# Patient Record
Sex: Male | Born: 1980
Health system: Southern US, Community
[De-identification: ages and names within clinical notes are randomized; demographics above are authoritative.]

---

## 2015-08-15 ENCOUNTER — Encounter (HOSPITAL_BASED_OUTPATIENT_CLINIC_OR_DEPARTMENT_OTHER): Payer: Self-pay | Admitting: *Deleted

## 2015-08-15 ENCOUNTER — Emergency Department (HOSPITAL_BASED_OUTPATIENT_CLINIC_OR_DEPARTMENT_OTHER)
Admission: EM | Admit: 2015-08-15 | Discharge: 2015-08-15 | Discharge status: Against Medical Advice | Payer: Self-pay | Attending: Dermatology | Admitting: Dermatology

## 2015-08-15 DIAGNOSIS — Z5321 Procedure and treatment not carried out due to patient leaving prior to being seen by health care provider: Secondary | ICD-10-CM | POA: Insufficient documentation

## 2015-08-15 DIAGNOSIS — H5711 Ocular pain, right eye: Secondary | ICD-10-CM | POA: Insufficient documentation

## 2015-08-15 MED ORDER — FLUORESCEIN SODIUM 1 MG OP STRP
ORAL_STRIP | OPHTHALMIC | Status: AC
  Filled 2015-08-15: qty 1

## 2015-08-15 MED ORDER — TETRACAINE HCL 0.5 % OP SOLN
OPHTHALMIC | Status: AC
  Filled 2015-08-15: qty 4

## 2015-08-15 NOTE — ED Triage Notes (Signed)
Patient called 3x, no answer. D/C LWBS at this time.

## 2015-08-15 NOTE — ED Triage Notes (Signed)
Patient states he accidentally hit his right eye with his finger this morning at 0700.  States he has pain and sensitivity to light.

## 2015-08-15 NOTE — ED Notes (Signed)
Called patient to treatment room, no answer. Looked outside and near vending machines.

## 2016-10-14 ENCOUNTER — Encounter (HOSPITAL_BASED_OUTPATIENT_CLINIC_OR_DEPARTMENT_OTHER): Payer: Self-pay | Admitting: *Deleted

## 2016-10-14 ENCOUNTER — Emergency Department (HOSPITAL_BASED_OUTPATIENT_CLINIC_OR_DEPARTMENT_OTHER)
Admission: EM | Admit: 2016-10-14 | Discharge: 2016-10-14 | Disposition: A | Discharge status: Home / Self Care | Payer: Self-pay | Attending: Emergency Medicine | Admitting: Emergency Medicine

## 2016-10-14 DIAGNOSIS — Y929 Unspecified place or not applicable: Secondary | ICD-10-CM | POA: Insufficient documentation

## 2016-10-14 DIAGNOSIS — Y998 Other external cause status: Secondary | ICD-10-CM | POA: Insufficient documentation

## 2016-10-14 DIAGNOSIS — S29012A Strain of muscle and tendon of back wall of thorax, initial encounter: Secondary | ICD-10-CM | POA: Insufficient documentation

## 2016-10-14 DIAGNOSIS — Y9389 Activity, other specified: Secondary | ICD-10-CM | POA: Insufficient documentation

## 2016-10-14 DIAGNOSIS — S39012A Strain of muscle, fascia and tendon of lower back, initial encounter: Secondary | ICD-10-CM

## 2016-10-14 DIAGNOSIS — X500XXA Overexertion from strenuous movement or load, initial encounter: Secondary | ICD-10-CM | POA: Insufficient documentation

## 2016-10-14 DIAGNOSIS — F17210 Nicotine dependence, cigarettes, uncomplicated: Secondary | ICD-10-CM | POA: Insufficient documentation

## 2016-10-14 MED ORDER — IBUPROFEN 400 MG PO TABS
600.0000 mg | ORAL_TABLET | Freq: Once | ORAL | Status: AC
  Administered 2016-10-14: 600 mg via ORAL
  Filled 2016-10-14: qty 1

## 2016-10-14 NOTE — ED Notes (Signed)
EDP into room, prior to RN assessment, see MD notes, orders received to medicate and d/c. Care assumed at time of d/c. Tx and d/c initiated by other RN. This pt not seen/visualized by this RN.

## 2016-10-14 NOTE — ED Provider Notes (Signed)
[facility]   CSN: [id number] Arrival date & time: 10/14/16  2155     History   Chief Complaint Chief Complaint  Patient presents with  . Back Injury    HPI Carl Frost is a 36 y.o. male.  HPI  36 year old male presents with acute right-sided thoracic back pain. This morning at around 9 AM he was lifting a heavy box and blocks weight made him start to fall backwards. He dropped the box and bent backwards but did not fall or have any direct trauma to his back. Since then he has had some mild pain in his right thoracic back. Just prior to arrival the pain seems all of a sudden worsened. Some ibuprofen when it first started but has not taken anything since he got worse. No radiation of the pain. No midline back pain. No weakness, numbness, or bowel/bladder incontinence. Any type of movement makes it worse.   History reviewed. No pertinent past medical history.  There are no active problems to display for this patient.   History reviewed. No pertinent surgical history.     Home Medications    Prior to Admission medications   Not on File    Family History No family history on file.  Social History Social History  Substance Use Topics  . Smoking status: Current Every Day Smoker    Packs/day: 0.50    Types: Cigarettes  . Smokeless tobacco: Not on file  . Alcohol use No     Allergies   Penicillins   Review of Systems Review of Systems  Constitutional: Negative for fever.  Gastrointestinal: Negative for abdominal pain.  Genitourinary: Negative for dysuria and hematuria.  Musculoskeletal: Positive for back pain.  Neurological: Negative for weakness and numbness.  All other systems reviewed and are negative.    Physical Exam Updated Vital Signs BP 122/90 (BP Location: Left Arm)   Pulse 69   Temp 98.6 F (37 C) (Oral)   Resp 16   Ht  (1.803 m)   Wt 86.2 kg (190 lb)   SpO2 100%   BMI 26.50 kg/m   Physical Exam    Constitutional: He is oriented to person, place, and time. He appears well-developed and well-nourished.  HENT:  Head: Normocephalic and atraumatic.  Right Ear: External ear normal.  Left Ear: External ear normal.  Nose: Nose normal.  Eyes: Right eye exhibits no discharge. Left eye exhibits no discharge.  Neck: Neck supple.  Cardiovascular: Normal rate, regular rhythm and normal heart sounds.   Pulmonary/Chest: Effort normal and breath sounds normal.  Abdominal: Soft. He exhibits no distension. There is no tenderness.  Musculoskeletal: He exhibits no edema.       Thoracic back: He exhibits tenderness (mild). He exhibits no bony tenderness.       Lumbar back: He exhibits no tenderness and no bony tenderness.       Back:  Neurological: He is alert and oriented to person, place, and time.  5/5 strength in BLE. Normal gross sensation  Skin: Skin is warm and dry.  Nursing note and vitals reviewed.    ED Treatments / Results  Labs (all labs ordered are listed, but only abnormal results are displayed) Labs Reviewed - No data to display  EKG  EKG Interpretation None       Radiology No results found.  Procedures Procedures (including critical care time)  Medications Ordered in ED Medications  ibuprofen (ADVIL,MOTRIN) tablet 600 mg (600 mg Oral Given 10/14/16  2234)     Initial Impression / Assessment and Plan / ED Course  I have reviewed the triage vital signs and the nursing notes.  Pertinent labs & imaging results that were available during my care of the patient were reviewed by me and considered in my medical decision making (see chart for details).     Patient presents with an acute muscle strain of his thoracic back. No concerning signs or symptoms such as weakness or numbness in his extremities or incontinence. Very low suspicion for an acute bony injury, ligamentous injury, or spinal cord emergency. NSAIDs, Tylenol, and heat. Discussed return  precautions.  Final Clinical Impressions(s) / ED Diagnoses   Final diagnoses:  Back strain, initial encounter    New Prescriptions There are no discharge medications for this patient.    Pricilla Loveless, MD 10/14/16 2306

## 2016-10-14 NOTE — ED Triage Notes (Signed)
Pt c/o right lower back injury x 12 hrs ago

## 2017-09-25 ENCOUNTER — Other Ambulatory Visit: Payer: Self-pay

## 2017-09-25 ENCOUNTER — Encounter (HOSPITAL_BASED_OUTPATIENT_CLINIC_OR_DEPARTMENT_OTHER): Payer: Self-pay | Admitting: Emergency Medicine

## 2017-09-25 ENCOUNTER — Emergency Department (HOSPITAL_BASED_OUTPATIENT_CLINIC_OR_DEPARTMENT_OTHER): Payer: Self-pay

## 2017-09-25 ENCOUNTER — Emergency Department (HOSPITAL_BASED_OUTPATIENT_CLINIC_OR_DEPARTMENT_OTHER)
Admission: EM | Admit: 2017-09-25 | Discharge: 2017-09-25 | Disposition: A | Discharge status: Home / Self Care | Payer: Self-pay | Attending: Emergency Medicine | Admitting: Emergency Medicine

## 2017-09-25 DIAGNOSIS — F17210 Nicotine dependence, cigarettes, uncomplicated: Secondary | ICD-10-CM | POA: Insufficient documentation

## 2017-09-25 DIAGNOSIS — J40 Bronchitis, not specified as acute or chronic: Secondary | ICD-10-CM | POA: Insufficient documentation

## 2017-09-25 MED ORDER — ALBUTEROL SULFATE HFA 108 (90 BASE) MCG/ACT IN AERS
1.0000 | INHALATION_SPRAY | Freq: Once | RESPIRATORY_TRACT | Status: AC
  Administered 2017-09-25: 1 via RESPIRATORY_TRACT
  Filled 2017-09-25: qty 6.7

## 2017-09-25 MED ORDER — PREDNISONE 10 MG PO TABS: 40.0000 mg | ORAL_TABLET | Freq: Every day | ORAL | 0 refills | Status: AC

## 2017-09-25 MED ORDER — BENZONATATE 100 MG PO CAPS: 100.0000 mg | ORAL_CAPSULE | Freq: Three times a day (TID) | ORAL | 0 refills | Status: DC

## 2017-09-25 MED ORDER — DEXAMETHASONE SODIUM PHOSPHATE 10 MG/ML IJ SOLN
10.0000 mg | Freq: Once | INTRAMUSCULAR | Status: AC
  Administered 2017-09-25: 10 mg via INTRAMUSCULAR
  Filled 2017-09-25: qty 1

## 2017-09-25 MED ORDER — PROMETHAZINE-DM 6.25-15 MG/5ML PO SYRP: 5.0000 mL | ORAL_SOLUTION | Freq: Four times a day (QID) | ORAL | 0 refills | Status: DC | PRN

## 2017-09-25 MED FILL — predniSONE 10 MG TABS: 10 | 4 days supply | Qty: 16 | Fill #0

## 2017-09-25 MED FILL — BENZONATATE 100 MG CAPSULE: 100 | 7 days supply | Qty: 21 | Fill #0

## 2017-09-25 MED FILL — PROMETHAZINE W/DM SYRUP: 6.25-15 | 6 days supply | Qty: 118 | Fill #0

## 2017-09-25 NOTE — ED Notes (Signed)
ED Provider at bedside. 

## 2017-09-25 NOTE — ED Triage Notes (Signed)
Pt c/o cough x several weeks, productive at times; c/o mid CP since Sat, feels "like something stuck in my chest and won't come up"

## 2017-09-25 NOTE — Discharge Instructions (Signed)
Use the inhaler every 2-4 hours over the next 2 days.  After this, use as needed for chest tightness or shortness of breath. Take prednisone as prescribed.  Take the entire course. Use cough syrup and cough drops to help decrease her coughing. Avoid things that may irritate your lungs, including exercise, or smoke inhalation.  Make sure you are staying well-hydrated with water. Your cough may continue for several weeks. Return to the emergency room if you develop persistent high fevers, worsening breathing, worsening shortness of breath, or any new or concerning symptoms.

## 2017-09-25 NOTE — ED Provider Notes (Signed)
[facility] EMERGENCY DEPARTMENT Provider Note   CSN: [id number] Arrival date & time: 09/25/17  3664     History   Chief Complaint Chief Complaint  Patient presents with  . Chest Pain  . Cough    HPI Carl Frost is a 37 y.o. male ending for evaluation of cough and chest tightness.  Patient states that the past 2 weeks, he has been having persistent cough.  It is productive, worse at night and when he first wakes up in the morning.  He has had intermittent fevers, although not in the past several days.  Today, he feels some chest tightness in the center of his chest, feels like when he coughs he needs to get something up but nothing is moving.  He has not taken anything for his symptoms, including Tylenol, ibuprofen, cough syrups.  He denies sick contacts.  He denies ear pain, eye pain, sore throat, nasal congestion, shortness of breath, chest pain, nausea, vomiting, abdominal pain, urinary symptoms, normal bowel movements.  He has no medical problems, takes medications daily.  He denies a history of asthma or COPD.  He smokes cigarettes daily, denies alcohol or drug use.  Patient works in a [employer].  HPI  History reviewed. No pertinent past medical history.  There are no active problems to display for this patient.   History reviewed. No pertinent surgical history.      Home Medications    Prior to Admission medications   Medication Sig Start Date End Date Taking? Authorizing Provider  benzonatate (TESSALON) 100 MG capsule Take 1 capsule (100 mg total) by mouth every 8 (eight) hours. 09/25/17   Tearah Saulsbury, PA-C  predniSONE (DELTASONE) 10 MG tablet Take 4 tablets (40 mg total) by mouth daily for 4 days. 09/25/17 09/29/17  Lamica Mccart, PA-C  promethazine-dextromethorphan (PROMETHAZINE-DM) 6.25-15 MG/5ML syrup Take 5 mLs by mouth 4 (four) times daily as needed for cough. 09/25/17   Kaidance Pantoja, PA-C    Family History No family history on  file.  Social History Social History   Tobacco Use  . Smoking status: Current Every Day Smoker    Packs/day: 0.50    Types: Cigarettes  . Smokeless tobacco: Never Used  Substance Use Topics  . Alcohol use: No  . Drug use: No     Allergies   Penicillins   Review of Systems Review of Systems  Respiratory: Positive for cough and chest tightness.   All other systems reviewed and are negative.    Physical Exam Updated Vital Signs BP 119/78   Pulse 77   Temp 98.2 F (36.8 C) (Oral)   Resp (!) 25   Ht 5\' 11"  (1.803 m)   Wt 81.6 kg   SpO2 95%   BMI 25.10 kg/m   Physical Exam  Constitutional: He is oriented to person, place, and time. He appears well-developed and well-nourished. No distress.  Appears nontoxic  HENT:  Head: Normocephalic and atraumatic.  Right Ear: Tympanic membrane, external ear and ear canal normal.  Left Ear: Tympanic membrane, external ear and ear canal normal.  Nose: Mucosal edema present. Right sinus exhibits no maxillary sinus tenderness and no frontal sinus tenderness. Left sinus exhibits no maxillary sinus tenderness and no frontal sinus tenderness.  Mouth/Throat: Uvula is midline, oropharynx is clear and moist and mucous membranes are normal. No tonsillar exudate.  Mild nasal mucosal edema.  OP clear without tonsillar swelling or exudate.  Uvula midline with equal palate rise.  TMs nonerythematous and  nonbulging bilaterally.  Eyes: Pupils are equal, round, and reactive to light. Conjunctivae and EOM are normal.  Neck: Normal range of motion.  Cardiovascular: Normal rate, regular rhythm and intact distal pulses.  Pulmonary/Chest: Effort normal. No respiratory distress. He has no decreased breath sounds. He has wheezes. He has no rhonchi. He has no rales.  Pt speaking in full sentences without difficulty.  Scattered wheezing in all fields.  Abdominal: Soft. He exhibits no distension. There is no tenderness.  Musculoskeletal: Normal range of  motion.  Lymphadenopathy:    He has no cervical adenopathy.  Neurological: He is alert and oriented to person, place, and time.  Skin: Skin is warm. Capillary refill takes less than 2 seconds.  Psychiatric: He has a normal mood and affect.  Nursing note and vitals reviewed.    ED Treatments / Results  Labs (all labs ordered are listed, but only abnormal results are displayed) Labs Reviewed - No data to display  EKG None   Radiology Dg Chest 2 View  Result Date: 09/25/2017 CLINICAL DATA:  Cough for several weeks EXAM: CHEST - 2 VIEW COMPARISON:  None. FINDINGS: Airway thickening with cuffing seen on the lateral view. There is no edema, consolidation, effusion, or pneumothorax. Normal heart size and mediastinal contours. IMPRESSION: Airway thickening Electronically Signed   By: Marnee SpringJonathon  Watts M.D.   On: 09/25/2017 10:49    Procedures Procedures (including critical care time)  Medications Ordered in ED Medications  dexamethasone (DECADRON) injection 10 mg (10 mg Intramuscular Given 09/25/17 1111)  albuterol (PROVENTIL HFA;VENTOLIN HFA) 108 (90 Base) MCG/ACT inhaler 1 puff (1 puff Inhalation Given 09/25/17 1111)     Initial Impression / Assessment and Plan / ED Course  I have reviewed the triage vital signs and the nursing notes.  Pertinent labs & imaging results that were available during my care of the patient were reviewed by me and considered in my medical decision making (see chart for details).     Patient presenting for evaluation of cough and chest tightness.  Physical exam reassuring, he is afebrile not tachycardic.  Appears nontoxic.  Has mild scattered wheezing.  No history of asthma or COPD.  Reports intermittent fevers of the past 2 weeks.  Will obtain chest x-ray to rule out pneumonia.  Chest x-ray viewed and interpreted by me, negative for pneumonia. Shows airway thickening, c/w bronchitis.  Decadron and inhaler given.  On reassessment, patient with improved air  movement, patient states he feels less tight.  Has continued mild wheezing.  Symptoms likely consistent with bronchitis.  Doubt PE.  Discussed findings with patient.  Discussed treatment with antitussives, prednisone, and inhaler.  Patient to return if symptoms worsen.  At this time, patient proceed for discharge.  Return precautions given.  Patient states he understands and agrees plan.   Final Clinical Impressions(s) / ED Diagnoses   Final diagnoses:  Bronchitis    ED Discharge Orders         Ordered    predniSONE (DELTASONE) 10 MG tablet  Daily     09/25/17 1122    benzonatate (TESSALON) 100 MG capsule  Every 8 hours     09/25/17 1122    promethazine-dextromethorphan (PROMETHAZINE-DM) 6.25-15 MG/5ML syrup  4 times daily PRN     09/25/17 1122           Padme Arriaga, PA-C 09/25/17 1153    Cathren LaineSteinl, Kevin, MD 09/25/17 1421

## 2018-07-20 ENCOUNTER — Other Ambulatory Visit: Payer: Self-pay

## 2018-07-20 DIAGNOSIS — Z20822 Contact with and (suspected) exposure to covid-19: Secondary | ICD-10-CM

## 2018-07-22 ENCOUNTER — Encounter (HOSPITAL_BASED_OUTPATIENT_CLINIC_OR_DEPARTMENT_OTHER): Payer: Self-pay | Admitting: Emergency Medicine

## 2018-07-22 ENCOUNTER — Emergency Department (HOSPITAL_BASED_OUTPATIENT_CLINIC_OR_DEPARTMENT_OTHER)
Admission: EM | Admit: 2018-07-22 | Discharge: 2018-07-22 | Disposition: A | Discharge status: Home / Self Care | Payer: Self-pay | Attending: Emergency Medicine | Admitting: Emergency Medicine

## 2018-07-22 ENCOUNTER — Other Ambulatory Visit: Payer: Self-pay

## 2018-07-22 DIAGNOSIS — M791 Myalgia, unspecified site: Secondary | ICD-10-CM | POA: Insufficient documentation

## 2018-07-22 DIAGNOSIS — R509 Fever, unspecified: Secondary | ICD-10-CM | POA: Insufficient documentation

## 2018-07-22 DIAGNOSIS — F17210 Nicotine dependence, cigarettes, uncomplicated: Secondary | ICD-10-CM | POA: Insufficient documentation

## 2018-07-22 DIAGNOSIS — J029 Acute pharyngitis, unspecified: Secondary | ICD-10-CM | POA: Insufficient documentation

## 2018-07-22 LAB — GROUP A STREP BY PCR: Group A Strep by PCR: NOT DETECTED

## 2018-07-22 MED ORDER — ACETAMINOPHEN 325 MG PO TABS
650.0000 mg | ORAL_TABLET | Freq: Once | ORAL | Status: AC | PRN
  Administered 2018-07-22: 650 mg via ORAL
  Filled 2018-07-22: qty 2

## 2018-07-22 NOTE — ED Provider Notes (Signed)
[facility] EMERGENCY DEPARTMENT Provider Note   CSN: [id number] Arrival date & time: 07/22/18  1453    History   Chief Complaint Chief Complaint  Patient presents with   Fever    HPI Carl Frost is a 38 y.o. male.     Patient is a 38 year old male who presents with a fever.  He has had a 2-day history of fever up to 102.  He has diffuse body aches.  He also has a sore throat and says it hurts to swallow.  He denies any shortness of breath or trouble controlling his saliva.  He denies any runny nose or congestion.  No coughing.  No vomiting or diarrhea.  He did have a COVID test yesterday at 1 in the community testing states but has not received results yet.  He denies any known sick contacts.  He has been using Tylenol and ibuprofen with some improvement in symptoms.     History reviewed. No pertinent past medical history.  There are no active problems to display for this patient.   History reviewed. No pertinent surgical history.      Home Medications    Prior to Admission medications   Medication Sig Start Date End Date Taking? Authorizing Provider  benzonatate (TESSALON) 100 MG capsule Take 1 capsule (100 mg total) by mouth every 8 (eight) hours. 09/25/17   Caccavale, Sophia, PA-C  promethazine-dextromethorphan (PROMETHAZINE-DM) 6.25-15 MG/5ML syrup Take 5 mLs by mouth 4 (four) times daily as needed for cough. 09/25/17   Caccavale, Sophia, PA-C    Family History No family history on file.  Social History Social History   Tobacco Use   Smoking status: Current Every Day Smoker    Packs/day: 0.50    Types: Cigarettes   Smokeless tobacco: Never Used  Substance Use Topics   Alcohol use: No   Drug use: No     Allergies   Penicillins   Review of Systems Review of Systems  Constitutional: Negative for chills, diaphoresis, fatigue and fever.  HENT: Positive for sore throat and trouble swallowing. Negative for congestion, rhinorrhea,  sneezing and voice change.   Eyes: Negative.   Respiratory: Negative for cough, chest tightness and shortness of breath.   Cardiovascular: Negative for chest pain and leg swelling.  Gastrointestinal: Negative for abdominal pain, blood in stool, diarrhea, nausea and vomiting.  Genitourinary: Negative for difficulty urinating, flank pain, frequency and hematuria.  Musculoskeletal: Positive for myalgias. Negative for arthralgias and back pain.  Skin: Negative for rash.  Neurological: Negative for dizziness, speech difficulty, weakness, numbness and headaches.     Physical Exam Updated Vital Signs BP 106/70 (BP Location: Right Arm)    Pulse 71    Temp 99.9 F (37.7 C) (Oral)    Resp 18    Ht 5\' 11"  (1.803 m)    Wt 88 kg    SpO2 97%    BMI 27.06 kg/m   Physical Exam Constitutional:      Appearance: He is well-developed.  HENT:     Head: Normocephalic and atraumatic.     Mouth/Throat:     Comments: Positive erythema to the posterior pharynx.  No uvular deviation or peritonsillar fullness.  No trismus.  He has no difficulty controlling his secretions. Eyes:     Pupils: Pupils are equal, round, and reactive to light.  Neck:     Musculoskeletal: Normal range of motion and neck supple.  Cardiovascular:     Rate and Rhythm: Normal rate and regular  rhythm.     Heart sounds: Normal heart sounds.  Pulmonary:     Effort: Pulmonary effort is normal. No respiratory distress.     Breath sounds: Normal breath sounds. No wheezing or rales.  Chest:     Chest wall: No tenderness.  Abdominal:     General: Bowel sounds are normal.     Palpations: Abdomen is soft.     Tenderness: There is no abdominal tenderness. There is no guarding or rebound.  Musculoskeletal: Normal range of motion.  Lymphadenopathy:     Cervical: No cervical adenopathy.  Skin:    General: Skin is warm and dry.     Findings: No rash.  Neurological:     Mental Status: He is alert and oriented to person, place, and time.       ED Treatments / Results  Labs (all labs ordered are listed, but only abnormal results are displayed) Labs Reviewed  GROUP A STREP BY PCR    EKG None  Radiology No results found.  Procedures Procedures (including critical care time)  Medications Ordered in ED Medications  acetaminophen (TYLENOL) tablet 650 mg (650 mg Oral Given 07/22/18 1533)     Initial Impression / Assessment and Plan / ED Course  I have reviewed the triage vital signs and the nursing notes.  Pertinent labs & imaging results that were available during my care of the patient were reviewed by me and considered in my medical decision making (see chart for details).       Patient is a 38 year old male who presents with sore throat and myalgias with fever.  He is otherwise well-appearing.  There is no suggestions of a peritonsillar abscess.  His strep test is negative.  He is awaiting results of a COVID test.  He was advised to quarantine and given other COVID precautions.  Return precautions were given.  Carl Frost was evaluated in Emergency Department on 07/22/2018 for the symptoms described in the history of present illness. He was evaluated in the context of the global COVID-19 pandemic, which necessitated consideration that the patient might be at risk for infection with the SARS-CoV-2 virus that causes COVID-19. Institutional protocols and algorithms that pertain to the evaluation of patients at risk for COVID-19 are in a state of rapid change based on information released by regulatory bodies including the CDC and federal and state organizations. These policies and algorithms were followed during the patient's care in the ED.    Final Clinical Impressions(s) / ED Diagnoses   Final diagnoses:  Pharyngitis, unspecified etiology    ED Discharge Orders    None       Malvin Johns, MD 07/22/18 1657

## 2018-07-22 NOTE — ED Triage Notes (Addendum)
Fever since Friday. Denies cough, N/v. Endorses sore throat. Tested for COVID on Friday at [facility] and has not received results.

## 2018-07-25 LAB — NOVEL CORONAVIRUS, NAA: SARS-CoV-2, NAA: NOT DETECTED

## 2018-07-30 ENCOUNTER — Telehealth: Payer: Self-pay | Admitting: General Practice

## 2018-07-30 NOTE — Telephone Encounter (Signed)
Patient advised of his Negative COVID results. Patient expressed understanding.

## 2018-10-14 ENCOUNTER — Emergency Department (HOSPITAL_BASED_OUTPATIENT_CLINIC_OR_DEPARTMENT_OTHER)
Admission: EM | Admit: 2018-10-14 | Discharge: 2018-10-14 | Disposition: A | Discharge status: Home / Self Care | Payer: Self-pay | Attending: Emergency Medicine | Admitting: Emergency Medicine

## 2018-10-14 ENCOUNTER — Other Ambulatory Visit: Payer: Self-pay

## 2018-10-14 ENCOUNTER — Emergency Department (HOSPITAL_BASED_OUTPATIENT_CLINIC_OR_DEPARTMENT_OTHER): Payer: Self-pay

## 2018-10-14 DIAGNOSIS — R0789 Other chest pain: Secondary | ICD-10-CM | POA: Insufficient documentation

## 2018-10-14 DIAGNOSIS — R079 Chest pain, unspecified: Secondary | ICD-10-CM

## 2018-10-14 DIAGNOSIS — Y939 Activity, unspecified: Secondary | ICD-10-CM | POA: Insufficient documentation

## 2018-10-14 DIAGNOSIS — T148XXA Other injury of unspecified body region, initial encounter: Secondary | ICD-10-CM | POA: Insufficient documentation

## 2018-10-14 DIAGNOSIS — X58XXXA Exposure to other specified factors, initial encounter: Secondary | ICD-10-CM | POA: Insufficient documentation

## 2018-10-14 DIAGNOSIS — Y999 Unspecified external cause status: Secondary | ICD-10-CM | POA: Insufficient documentation

## 2018-10-14 DIAGNOSIS — Y929 Unspecified place or not applicable: Secondary | ICD-10-CM | POA: Insufficient documentation

## 2018-10-14 DIAGNOSIS — F17210 Nicotine dependence, cigarettes, uncomplicated: Secondary | ICD-10-CM | POA: Insufficient documentation

## 2018-10-14 LAB — TROPONIN I (HIGH SENSITIVITY): Troponin I (High Sensitivity): 2 ng/L (ref <18)

## 2018-10-14 LAB — CBC
HCT: 50 % (ref 39.0–52.0)
Hemoglobin: 15.9 g/dL (ref 13.0–17.0)
MCH: 27.7 pg (ref 26.0–34.0)
MCHC: 31.8 g/dL (ref 30.0–36.0)
MCV: 87 fL (ref 80.0–100.0)
Platelets: 247 10*3/uL (ref 150–400)
RBC: 5.75 MIL/uL (ref 4.22–5.81)
RDW: 13 % (ref 11.5–15.5)
WBC: 4.6 10*3/uL (ref 4.0–10.5)
nRBC: 0 % (ref 0.0–0.2)

## 2018-10-14 LAB — BASIC METABOLIC PANEL
Anion gap: 10 (ref 5–15)
BUN: 9 mg/dL (ref 6–20)
CO2: 23 mmol/L (ref 22–32)
Calcium: 9.1 mg/dL (ref 8.9–10.3)
Chloride: 104 mmol/L (ref 98–111)
Creatinine, Ser: 0.87 mg/dL (ref 0.61–1.24)
GFR calc Af Amer: >60 mL/min (ref >60)
GFR calc non Af Amer: >60 mL/min (ref >60)
Glucose, Bld: 130 mg/dL — ABNORMAL HIGH (ref 70–99)
Potassium: 3.3 mmol/L — ABNORMAL LOW (ref 3.5–5.1)
Sodium: 137 mmol/L (ref 135–145)

## 2018-10-14 LAB — D-DIMER, QUANTITATIVE: D-Dimer, Quant: <0.27 ug/mL-FEU (ref 0.00–0.50)

## 2018-10-14 MED ORDER — CYCLOBENZAPRINE HCL 10 MG PO TABS: 10.0000 mg | ORAL_TABLET | Freq: Two times a day (BID) | ORAL | 0 refills | Status: DC | PRN

## 2018-10-14 MED ORDER — KETOROLAC TROMETHAMINE 60 MG/2ML IM SOLN
60.0000 mg | Freq: Once | INTRAMUSCULAR | Status: DC
  Filled 2018-10-14: qty 2

## 2018-10-14 MED ORDER — KETOROLAC TROMETHAMINE 30 MG/ML IJ SOLN
30.0000 mg | Freq: Once | INTRAMUSCULAR | Status: AC
  Administered 2018-10-14: 14:00:00 30 mg via INTRAVENOUS
  Filled 2018-10-14: qty 1

## 2018-10-14 MED ORDER — CYCLOBENZAPRINE HCL 10 MG PO TABS
10.0000 mg | ORAL_TABLET | Freq: Once | ORAL | Status: AC
  Administered 2018-10-14: 10 mg via ORAL
  Filled 2018-10-14: qty 1

## 2018-10-14 NOTE — ED Provider Notes (Signed)
[facility] EMERGENCY DEPARTMENT Provider Note   CSN: [id number] Arrival date & time: 10/14/18  1203     History   Chief Complaint Chief Complaint  Patient presents with  . Chest Pain    HPI Carl Frost is a 38 y.o. male.     HPI   38 year old male with no significant medical history presents with concern for chest pain.  Reports that he woke up this morning with right-sided chest pain and shortness of breath.  Describes it as a sharp pain, that is worse with deep breaths as well as movements.  The pain is located centrally and slightly to the right in the lower chest.  He has never had symptoms like this before.  He denies associated nausea, vomiting, cough, fever.  He has a history of smoking, denies drug use.  Does report some alcohol use.  No known family history of early heart disease or DVT or PE.  No personal history of DVT or PE.  No recent surgeries.  Does report 1 week ago driving in a car approximately 8 hours.  That pain is severe.  No past medical history on file.  There are no active problems to display for this patient.   No past surgical history on file.      Home Medications    Prior to Admission medications   Medication Sig Start Date End Date Taking? Authorizing Provider  cyclobenzaprine (FLEXERIL) 10 MG tablet Take 1 tablet (10 mg total) by mouth 2 (two) times daily as needed for muscle spasms. 10/14/18   Alvira Monday, MD    Family History No family history on file.  Social History Social History   Tobacco Use  . Smoking status: Current Every Day Smoker    Packs/day: 0.50    Types: Cigarettes  . Smokeless tobacco: Never Used  Substance Use Topics  . Alcohol use: No  . Drug use: No     Allergies   Penicillins   Review of Systems Review of Systems  Constitutional: Negative for fever.  HENT: Negative for sore throat.   Eyes: Negative for visual disturbance.  Respiratory: Positive for shortness of breath. Negative  for cough.   Cardiovascular: Positive for chest pain. Negative for leg swelling.  Gastrointestinal: Negative for abdominal pain, nausea and vomiting.  Genitourinary: Negative for difficulty urinating.  Musculoskeletal: Negative for back pain and neck stiffness.  Skin: Negative for rash.  Neurological: Negative for syncope and headaches.     Physical Exam Updated Vital Signs BP 100/66   Pulse 63   Temp 98.4 F (36.9 C) (Oral)   Resp 18   Ht 5\' 10"  (1.778 m)   Wt 97.5 kg   SpO2 100%   BMI 30.85 kg/m   Physical Exam Vitals signs and nursing note reviewed.  Constitutional:      General: He is not in acute distress.    Appearance: He is well-developed. He is not diaphoretic.  HENT:     Head: Normocephalic and atraumatic.  Eyes:     Conjunctiva/sclera: Conjunctivae normal.  Neck:     Musculoskeletal: Normal range of motion.  Cardiovascular:     Rate and Rhythm: Normal rate and regular rhythm.     Heart sounds: Normal heart sounds. No murmur. No friction rub. No gallop.   Pulmonary:     Effort: Pulmonary effort is normal. No respiratory distress.     Breath sounds: Normal breath sounds. No wheezing or rales.  Chest:     Chest  wall: Tenderness (right lower chest wall) present.  Abdominal:     General: There is no distension.     Palpations: Abdomen is soft.     Tenderness: There is no abdominal tenderness. There is no guarding.  Skin:    General: Skin is warm and dry.  Neurological:     Mental Status: He is alert and oriented to person, place, and time.      ED Treatments / Results  Labs (all labs ordered are listed, but only abnormal results are displayed) Labs Reviewed  BASIC METABOLIC PANEL - Abnormal; Notable for the following components:      Result Value   Potassium 3.3 (*)    Glucose, Bld 130 (*)    All other components within normal limits  CBC  D-DIMER, QUANTITATIVE (NOT AT [facility])  TROPONIN I (HIGH SENSITIVITY)    EKG EKG Interpretation   Date/Time:  Sunday October 14 2018 12:47:04 EDT Ventricular Rate:  73 PR Interval:    QRS Duration: 88 QT Interval:  358 QTC Calculation: 395 R Axis:   72 Text Interpretation:  Sinus arrhythmia Prolonged PR interval Probable left atrial enlargement ST elev, probable normal early repol pattern TW inversion III new from prior, no other significant changes Confirmed by Mirabel Ahlgren ([id number]) on 10/14/2018 12:57:33 PM   Radiology Dg Chest 2 View  Result Date: 10/14/2018 CLINICAL DATA:  Chest pain.  Smoker.  No cough. EXAM: CHEST - 2 VIEW COMPARISON:  09/25/2017 FINDINGS: Mild pectus excavatum deformity. Numerous leads and wires project over the chest. Midline trachea. Normal heart size and mediastinal contours. No pleural effusion or pneumothorax. Clear lungs. IMPRESSION: Normal chest. Electronically Signed   By: Kyle  Talbot M.D.   On: 10/14/2018 13:39    Procedures Procedures (including critical care time)  Medications Ordered in ED Medications  cyclobenzaprine (FLEXERIL) tablet 10 mg (10 mg Oral Given 10/14/18 1421)  ketorolac (TORADOL) 30 MG/ML injection 30 mg (30 mg Intravenous Given 10/14/18 1421)     Initial Impression / Assessment and Plan / ED Course  I have reviewed the triage vital signs and the nursing notes.  Pertinent labs & imaging results that were available during my care of the patient were reviewed by me and considered in my medical decision making (see chart for details).        38  year old male with history of smoking presents with concern for chest pain.  Differential diagnosis for chest pain includes pulmonary embolus, dissection, pneumothorax, pneumonia, ACS, myocarditis, pericarditis.  EKG was done and evaluate by me and showed no acute ST changes and no signs of pericarditis. Chest x-ray was done and evaluated by me and radiology and showed no sign of pneumonia or pneumothorax. Patient is low risk [place] but did have recent long trip and has dyspnea--ddimer  negative and have low suspicion for PE.  Patient is low risk HEART score with negative troponin. No sign of dissection or intraabdominal cause by hx or exam.   Patient most likely with musculoskeletal chest pain given pain with palpation as well as movements. Given Toradol and flexeril for pain in the ED.  Patient discharged in stable condition with understanding of reasons to return and recommend PCP follow up.   Final Clinical Impressions(s) / ED Diagnoses   Final diagnoses:  Chest pain, unspecified type  Chest wall pain  Muscle strain    ED Discharge Orders         Ordered    cyclobenzaprine (FLEXERIL) 10 MG tablet  2 times  daily PRN     10/14/18 1400           Gareth Morgan, MD 10/14/18 2250

## 2018-10-14 NOTE — ED Triage Notes (Signed)
Pt c/o right side chest pain with sob, Pt denies cough, denies injury. Pt denies pain radiation. Pt on phone in triage. Normal gait

## 2019-05-21 ENCOUNTER — Emergency Department (HOSPITAL_BASED_OUTPATIENT_CLINIC_OR_DEPARTMENT_OTHER)
Admission: EM | Admit: 2019-05-21 | Discharge: 2019-05-21 | Disposition: A | Discharge status: Home / Self Care | Payer: Self-pay | Attending: Emergency Medicine | Admitting: Emergency Medicine

## 2019-05-21 ENCOUNTER — Encounter (HOSPITAL_BASED_OUTPATIENT_CLINIC_OR_DEPARTMENT_OTHER): Payer: Self-pay | Admitting: Emergency Medicine

## 2019-05-21 ENCOUNTER — Other Ambulatory Visit: Payer: Self-pay

## 2019-05-21 DIAGNOSIS — M545 Low back pain, unspecified: Secondary | ICD-10-CM

## 2019-05-21 DIAGNOSIS — Z880 Allergy status to penicillin: Secondary | ICD-10-CM | POA: Insufficient documentation

## 2019-05-21 MED ORDER — CYCLOBENZAPRINE HCL 10 MG PO TABS: 10.0000 mg | ORAL_TABLET | Freq: Two times a day (BID) | ORAL | 0 refills | Status: DC | PRN

## 2019-05-21 NOTE — ED Triage Notes (Signed)
Pt c/o bilateral lower back pain. Pt denies injury, denies dysuria. Pt reports pain started when waking up x 2 days pta.

## 2019-05-21 NOTE — ED Provider Notes (Signed)
[facility] EMERGENCY DEPARTMENT Provider Note   CSN: [id number] Arrival date & time: 05/21/19  9381     History Chief Complaint  Patient presents with  . Back Pain    Carl Frost is a 39 y.o. male who presents with back pain. He states that he woke up with back pain on Sunday. He usually works 6 days a week but had all weekend off and just laid around all weekend. He states the pain feels like a pulled muscle in the lower back. The pain is across the lower back and it's worse with movement. He tried a heating pad for his symptoms which helped some. He tried to go to work today but his back hurt too much so he decided to come here. He had some nausea a couple days ago but no vomiting and denies nausea now. He had some urinary hesitancy a couple days ago as well but this has resolved too. No fever, syncope, trauma, unexplained weight loss, hx of cancer, loss of bowel/bladder function, saddle anesthesia, urinary retention, IVDU, radicular pain or leg weakness.    HPI     History reviewed. No pertinent past medical history.  There are no problems to display for this patient.   History reviewed. No pertinent surgical history.     History reviewed. No pertinent family history.  Social History   Tobacco Use  . Smoking status: Current Every Day Smoker    Packs/day: 0.50    Types: Cigarettes  . Smokeless tobacco: Never Used  Substance Use Topics  . Alcohol use: No  . Drug use: No    Home Medications Prior to Admission medications   Medication Sig Start Date End Date Taking? Authorizing Provider  cyclobenzaprine (FLEXERIL) 10 MG tablet Take 1 tablet (10 mg total) by mouth 2 (two) times daily as needed for muscle spasms. 05/21/19   Bethel Born, PA-C    Allergies    Penicillins  Review of Systems   Review of Systems  Constitutional: Negative for fever.  Gastrointestinal: Positive for nausea (resolved). Negative for abdominal pain and vomiting.    Genitourinary: Positive for difficulty urinating (resolved). Negative for dysuria, flank pain and hematuria.  Musculoskeletal: Positive for back pain.  Neurological: Negative for weakness and numbness.    Physical Exam Updated Vital Signs BP 121/75 (BP Location: Right Arm)   Pulse 83   Temp 97.8 F (36.6 C) (Oral)   Resp 14   Ht 5\' 11"  (1.803 m)   Wt 101.8 kg   SpO2 100%   BMI 31.31 kg/m   Physical Exam Vitals and nursing note reviewed.  Constitutional:      General: He is not in acute distress.    Appearance: Normal appearance. He is well-developed. He is not ill-appearing.  HENT:     Head: Normocephalic and atraumatic.  Eyes:     General: No scleral icterus.       Right eye: No discharge.        Left eye: No discharge.     Conjunctiva/sclera: Conjunctivae normal.     Pupils: Pupils are equal, round, and reactive to light.  Cardiovascular:     Rate and Rhythm: Normal rate.  Pulmonary:     Effort: Pulmonary effort is normal. No respiratory distress.  Abdominal:     General: There is no distension.     Palpations: Abdomen is soft.     Tenderness: There is no abdominal tenderness.  Musculoskeletal:     Cervical back: Normal  range of motion.     Comments: Back: Inspection: No masses, deformity, or rash Palpation: No midline spinal tenderness. No paraspinal muscle tenderness. Strength: 5/5 in lower extremities and normal plantar and dorsiflexion Sensation: Intact sensation with light touch in lower extremities bilaterally Reflexes: Patellar reflex is 2+ bilaterally SLR: Negative seated straight leg raise Gait: Normal gait   Skin:    General: Skin is warm and dry.  Neurological:     Mental Status: He is alert and oriented to person, place, and time.  Psychiatric:        Behavior: Behavior normal.     ED Results / Procedures / Treatments   Labs (all labs ordered are listed, but only abnormal results are displayed) Labs Reviewed - No data to  display  EKG None  Radiology No results found.  Procedures Procedures (including critical care time)  Medications Ordered in ED Medications - No data to display  ED Course  I have reviewed the triage vital signs and the nursing notes.  Pertinent labs & imaging results that were available during my care of the patient were reviewed by me and considered in my medical decision making (see chart for details).  39 year old male presents with acute low back pain for the past 2 days.  He states that he has not lifted anything heavy but thinks his symptoms may be due to laying around all weekend which he does not usually do.  Feels like he has pulled a muscle.  He has no red flags on history or on exam therefore will defer any imaging today.  Did describe some nausea and some urinary hesitancy that occurred a couple days ago but this is resolved.  He was offered urinalysis but declined and prefers just getting a prescription for a muscle relaxer and a work note which was given.  He is advised to return if worsening.  MDM Rules/Calculators/A&P                       Final Clinical Impression(s) / ED Diagnoses Final diagnoses:  Acute bilateral low back pain without sciatica    Rx / DC Orders ED Discharge Orders         Ordered    cyclobenzaprine (FLEXERIL) 10 MG tablet  2 times daily PRN     05/21/19 0935           Recardo Evangelist, PA-C 05/21/19 9604    Varney Biles, MD 05/23/19 5409

## 2019-05-21 NOTE — ED Notes (Signed)
Pharmacy and medications updated with patient 

## 2019-05-21 NOTE — ED Notes (Signed)
ED Provider at bedside. 

## 2019-05-21 NOTE — ED Notes (Signed)
Pt discharged to home. Discharge instructions have been discussed with patient and/or family members. Pt verbally acknowledges understanding d/c instructions, and endorses comprehension to checkout at registration before leaving.  °

## 2019-05-21 NOTE — Discharge Instructions (Signed)
Take NSAIDs or Tylenol as needed for the next week. Take this medicine with food. Take muscle relaxer at bedtime to help you sleep. This medicine makes you drowsy so do not take before driving or work Use a heating pad for sore muscles - use for 20 minutes several times a day Try gentle range of motion exercises Return for worsening symptoms  

## 2020-06-15 IMAGING — CR DG CHEST 2V
2 series · 2 of 2 positions shown · non-contrast
Comparison: 09/25/2017

CLINICAL DATA: Chest pain.  Smoker.  No cough.

EXAM:
CHEST - 2 VIEW

[w chest pa]
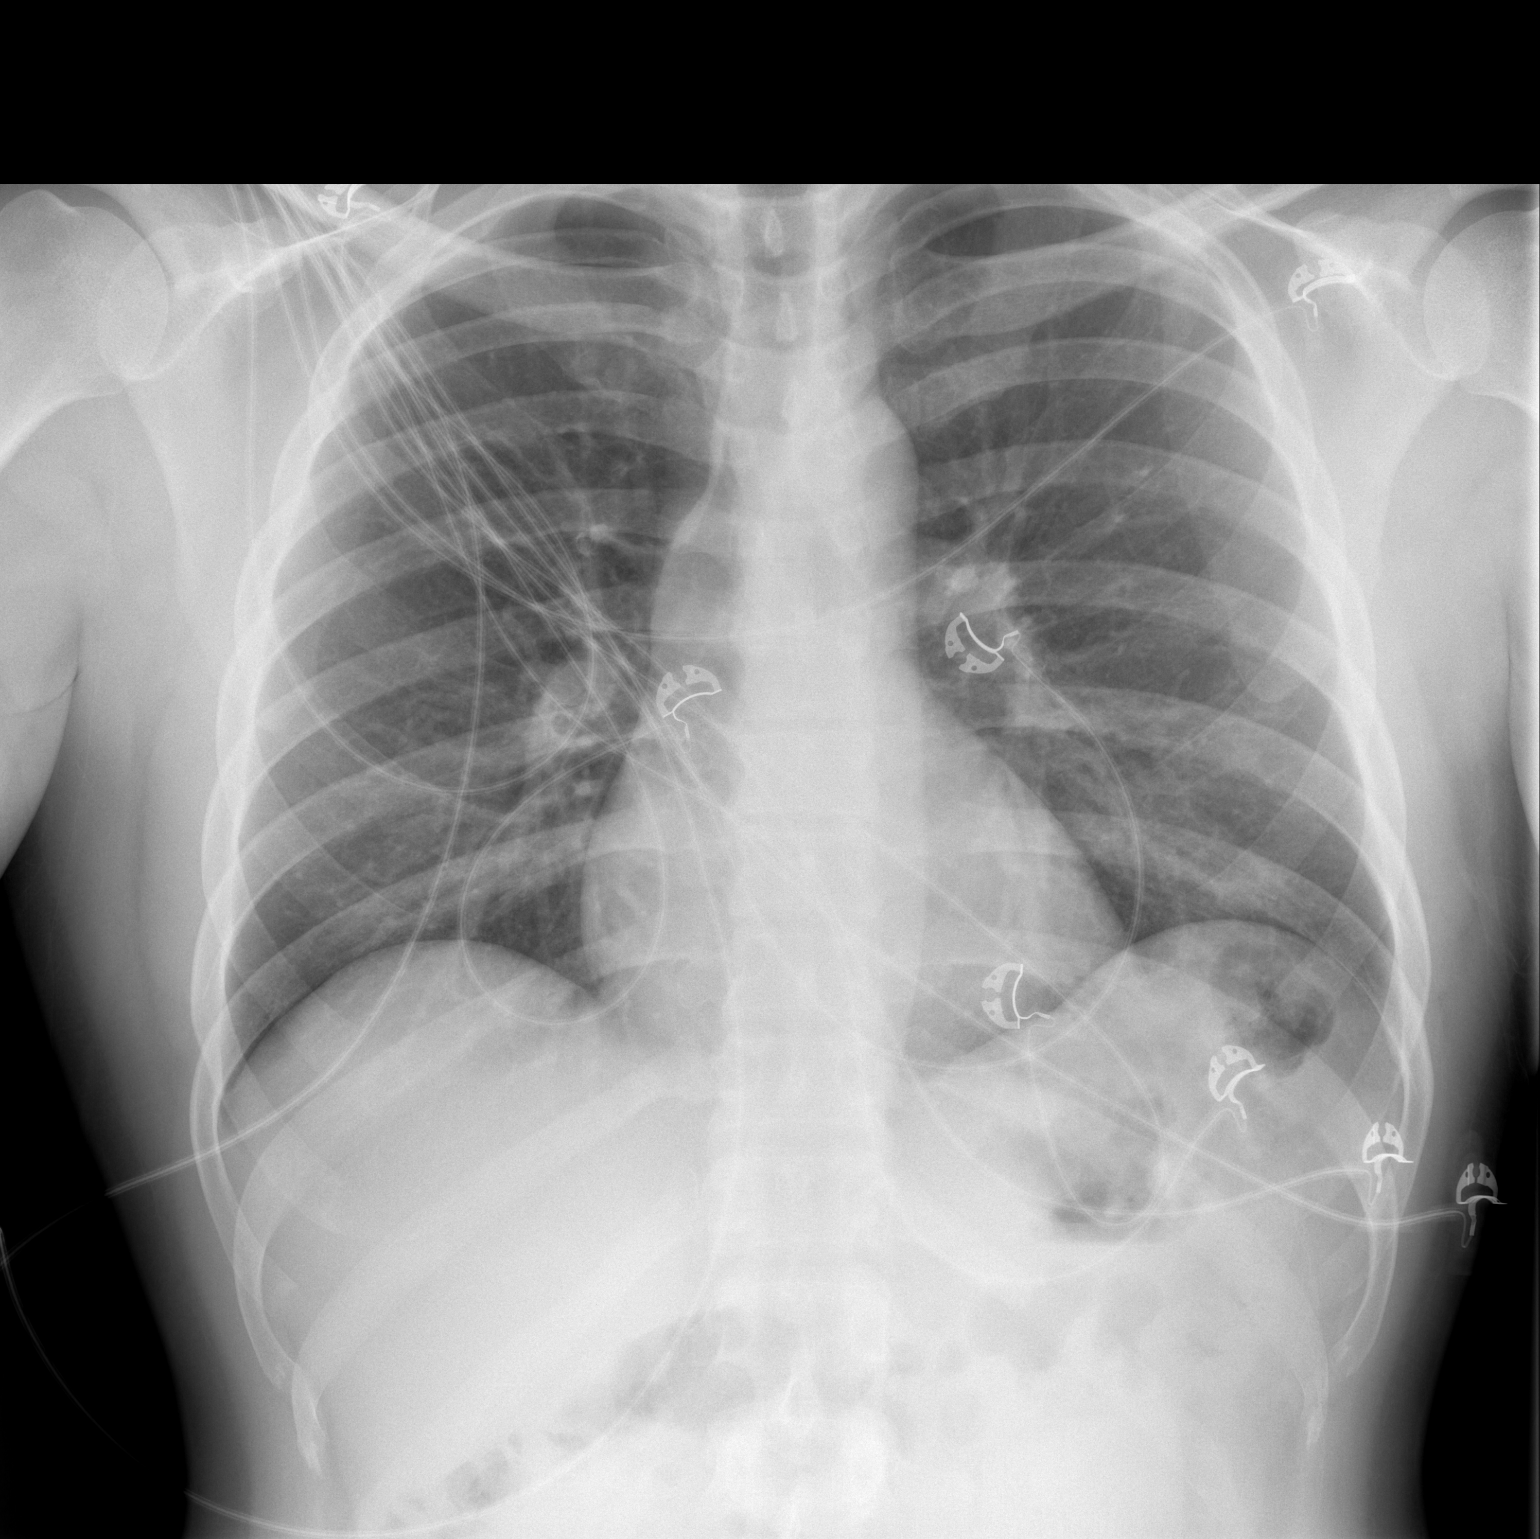

[w chest lat]
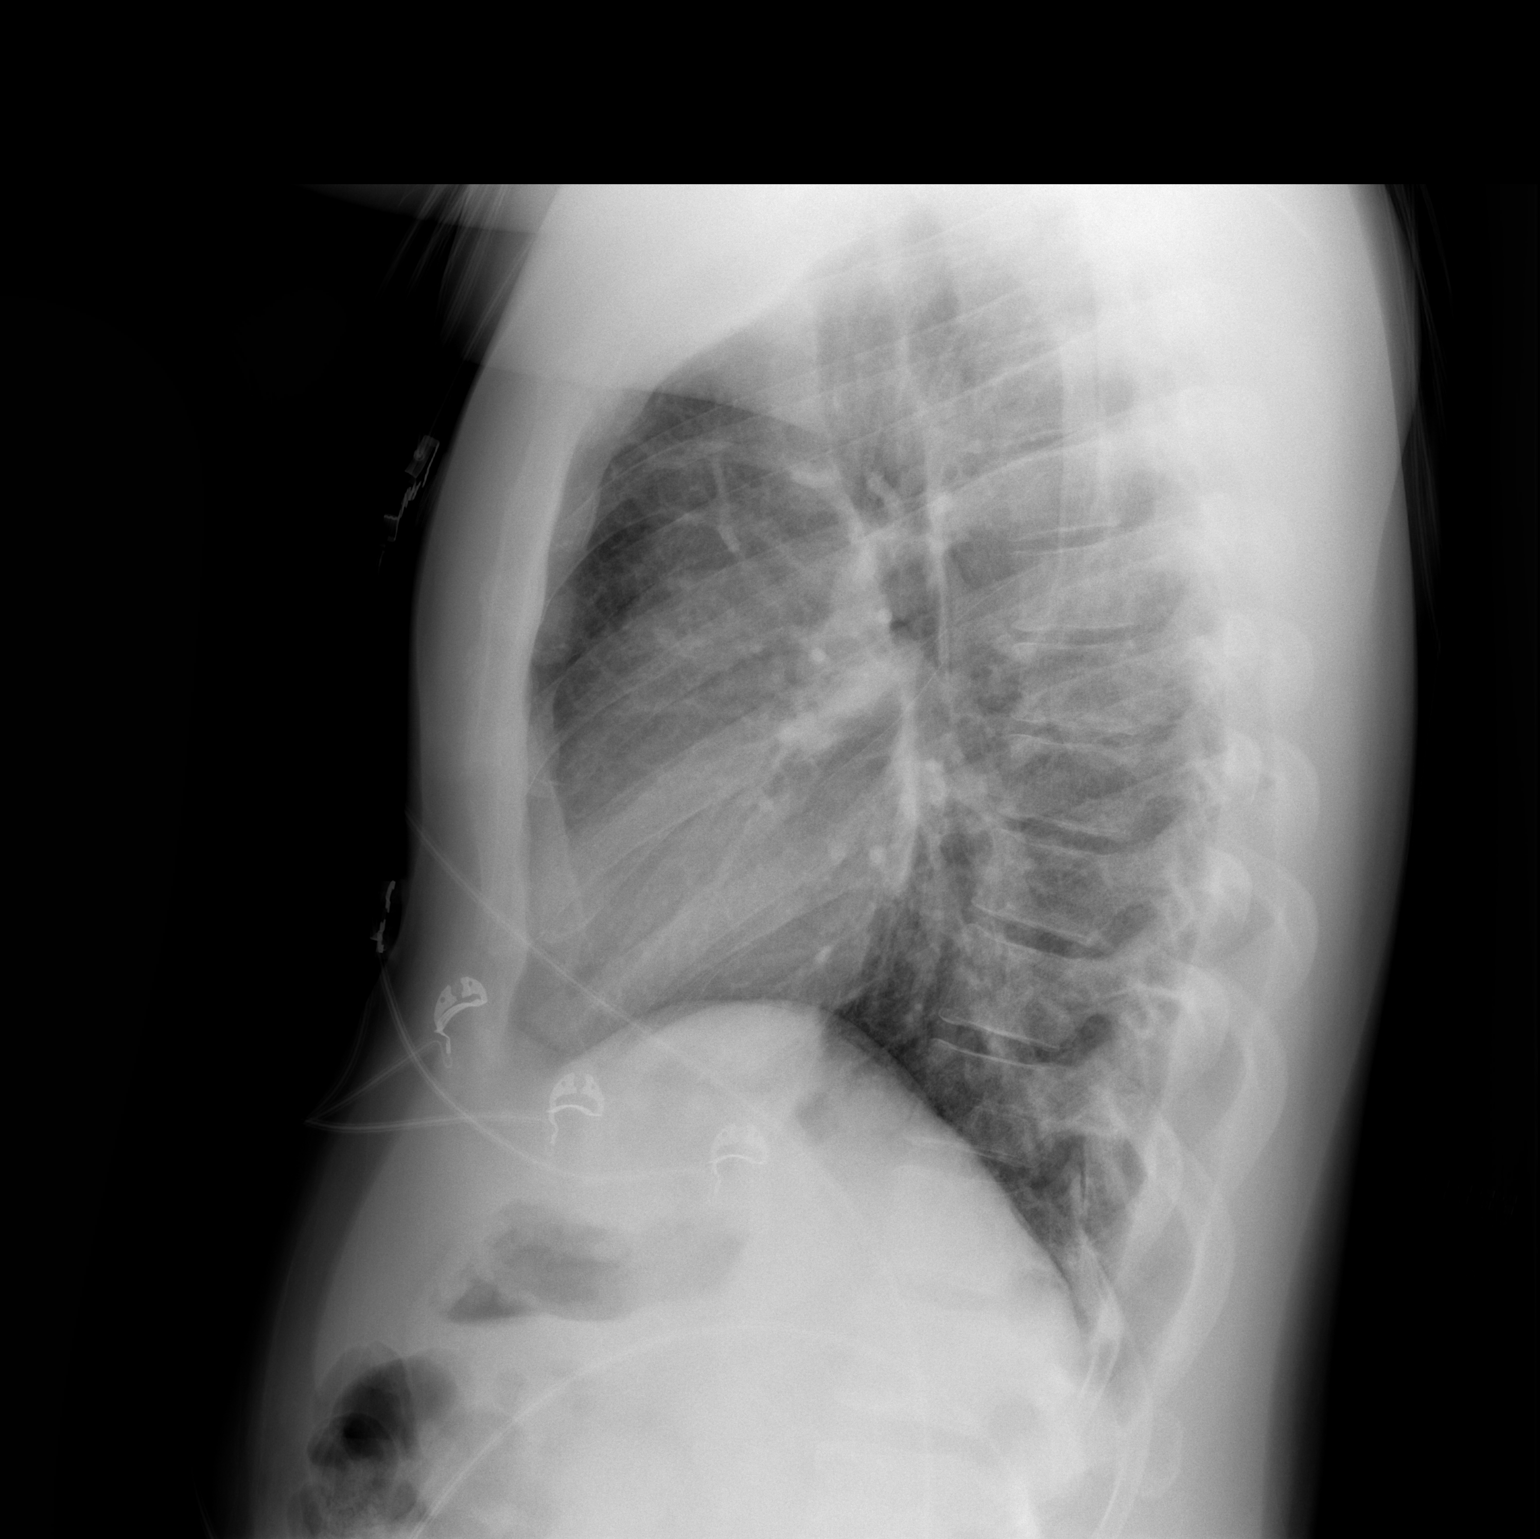

[2 of 2 positions shown; findings below may reference images not displayed]

FINDINGS: Mild pectus excavatum deformity. Numerous leads and wires project
over the chest. Midline trachea. Normal heart size and mediastinal
contours. No pleural effusion or pneumothorax. Clear lungs.
IMPRESSION: Normal chest.

## 2023-05-15 ENCOUNTER — Encounter (HOSPITAL_BASED_OUTPATIENT_CLINIC_OR_DEPARTMENT_OTHER): Payer: Self-pay

## 2023-05-15 ENCOUNTER — Other Ambulatory Visit: Payer: Self-pay

## 2023-05-15 ENCOUNTER — Emergency Department (HOSPITAL_BASED_OUTPATIENT_CLINIC_OR_DEPARTMENT_OTHER)
Admission: EM | Admit: 2023-05-15 | Discharge: 2023-05-15 | Disposition: A | Discharge status: Home / Self Care | Payer: Self-pay | Attending: Emergency Medicine | Admitting: Emergency Medicine

## 2023-05-15 DIAGNOSIS — M545 Low back pain, unspecified: Secondary | ICD-10-CM | POA: Insufficient documentation

## 2023-05-15 MED ORDER — KETOROLAC TROMETHAMINE 15 MG/ML IJ SOLN
15.0000 mg | Freq: Once | INTRAMUSCULAR | Status: AC
  Administered 2023-05-15: 15 mg via INTRAMUSCULAR
  Filled 2023-05-15: qty 1

## 2023-05-15 MED ORDER — LIDOCAINE 5 % EX PTCH: 1.0000 | MEDICATED_PATCH | CUTANEOUS | 0 refills | Status: AC

## 2023-05-15 MED ORDER — NAPROXEN 375 MG PO TABS: 375.0000 mg | ORAL_TABLET | Freq: Two times a day (BID) | ORAL | 0 refills | Status: AC

## 2023-05-15 MED ORDER — CYCLOBENZAPRINE HCL 10 MG PO TABS: 10.0000 mg | ORAL_TABLET | Freq: Three times a day (TID) | ORAL | 0 refills | Status: AC | PRN

## 2023-05-15 NOTE — ED Triage Notes (Signed)
 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

## 2023-05-15 NOTE — ED Provider Notes (Signed)
 [facility]ERGENCY DEPARTMENT AT [facility] Provider Note   CSN: [id number] Arrival date & time: 05/15/23  1052     History  Chief Complaint  Patient presents with   Back Pain    Carl Frost is a 43 y.o. male who presents to the emergency department with a chief complaint of low back pain.  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  Patient states that he felt like he pulled a muscle on Friday and then Saturday he woke up with significant low back pain.  Patient states that this has happened once in the past a few years ago and he took prescription muscle relaxers and the pain went away.  Patient states that he has tried a heating pad at home, 2 pills of a muscle relaxer from a family member with no relief.  Patient denies chest pain, shortness of breath, saddle anesthesia, urinary/stool incontinence, unexplained weakness.  Patient states that he does feel like the pain radiates down his leg at times, with a feeling of numbness.   Back Pain Associated symptoms: no abdominal pain, no chest pain, no fever, no headaches and no weakness       Home Medications Prior to Admission medications   Medication Sig Start Date End Date Taking? Authorizing Provider  cyclobenzaprine  (FLEXERIL ) 10 MG tablet Take 1 tablet (10 mg total) by mouth 2 (two) times daily as needed for muscle spasms. 05/21/19   Maryanna Smart, PA-C      Allergies    Penicillins    Review of Systems   Review of Systems  Constitutional:  Positive for activity change. Negative for appetite change, chills and fever.  Respiratory:  Negative for chest tightness and shortness of breath.   Cardiovascular:  Negative for chest pain.  Gastrointestinal:  Negative for abdominal pain, diarrhea, nausea and vomiting.  Genitourinary:  Negative for difficulty urinating.  Musculoskeletal:  Positive for back pain. Negative for neck pain.  Skin:  Negative for rash  and wound.  Neurological:  Negative for dizziness, seizures, syncope, weakness and headaches.    Physical Exam Updated Vital Signs BP 119/83 (BP Location: Left Arm)   Pulse 80   Temp 98.1 F (36.7 C) (Oral)   Resp 16   Ht 6' (1.829 m)   Wt 106.6 kg   SpO2 96%   BMI 31.87 kg/m  Physical Exam Vitals and nursing note reviewed.  Constitutional:      General: He is not in acute distress.    Appearance: Normal appearance. He is not ill-appearing, toxic-appearing or diaphoretic.  HENT:     Head: Normocephalic and atraumatic.  Eyes:     Extraocular Movements: Extraocular movements intact.  Cardiovascular:     Rate and Rhythm: Normal rate and regular rhythm.  Pulmonary:     Effort: Pulmonary effort is normal. No respiratory distress.     Breath sounds: Normal breath sounds. No wheezing, rhonchi or rales.  Abdominal:     General: Abdomen is flat.     Palpations: Abdomen is soft.  Musculoskeletal:        General: Tenderness (Bilateral tenderness over lumbar paraspinal mucles, no tenderness over lumbar spine itself) present.     Cervical back: Normal range of motion. No tenderness.     Thoracic back: No tenderness.     Lumbar back: Tenderness (paraspinal muscles) present. No bony tenderness. Positive right straight leg raise test and positive left straight  leg raise test.  Skin:    General: Skin is warm and dry.     Capillary Refill: Capillary refill takes less than 2 seconds.  Neurological:     General: No focal deficit present.     Mental Status: He is alert and oriented to person, place, and time.     Cranial Nerves: No cranial nerve deficit.     Sensory: No sensory deficit.     Motor: No weakness.     Coordination: Coordination normal.  Psychiatric:        Mood and Affect: Mood normal.        Behavior: Behavior normal.     ED Results / Procedures / Treatments   Labs (all labs ordered are listed, but only abnormal results are displayed) Labs Reviewed - No data to  display  EKG None  Radiology No results found.  Procedures Procedures    Medications Ordered in ED Medications - No data to display  ED Course/ Medical Decision Making/ A&P    Patient presents to the ED for concern of low back pain, this involves an extensive number of treatment options, and is a complaint that carries with it a high risk of complications and morbidity.  The differential diagnosis includes/injury, cauda equina syndrome, fracture, osteoarthritis, spinal stenosis, muscle strain etc.   Co morbidities that complicate the patient evaluation  None   Additional history obtained:  External records from outside source obtained and reviewed including previous ED visit for similar complaint in 2021   Medicines ordered and prescription drug management:  I ordered medication including Toradol  for pain, lidocaine patch, muscle relaxer, NSAID for pain and inflammation outpatient Reevaluation of the patient after these medicines showed that the patient improved I have reviewed the patients home medicines and have made adjustments as needed   Test Considered:  Lumbar xray: Patient has no previous imaging, based on history, physical exam patient only has pain over paraspinal muscles of lumbar spine, no pain directly over lumbar spine.  Imaging today would not change treatment approach.   Critical Interventions:  none   Problem List / ED Course:  Low back pain Patient presents for low back pain after work on Friday, states that Saturday his low back became increasingly painful and that he reported to work this morning but was unable to bear the pain.  Patient denies significant past medical history related to his lumbar spine other than 1 incidence of a similar episode of pain occurring years ago.  Denies red flag symptoms including chest pain, shortness of breath, saddle anesthesia, urinary/stool incontinence Toradol  IM given today in the emergency department for  pain Patient prescribed anti-inflammatory, lidocaine patch, muscle relaxer for outpatient relief. PCP referral number given today, patient should establish with a primary care provider or return to the emergency department if pain persists or worsens Return precautions given Work note given   Reevaluation:  After the interventions noted above, I reevaluated the patient and found that they have :improved   Social Determinants of Health:  No PCP  Dispostion:  After consideration of the diagnostic results and the patients response to treatment, I feel that the patent would benefit from discharge and outpatient therapy.  Patient instructed to follow-up with primary care provider or return to the emergency department if he experiences any red flag symptoms, including but not limited to urinary/stool incontinence, chest pain, shortness of breath, saddle anesthesia, unexplained weakness or worsening pain. Angela Barban here for ABCD2, HEART and other calculatorsREFRESH  Note before signing :1}                              Medical Decision Making         Final Clinical Impression(s) / ED Diagnoses Final diagnoses:  None    Rx / DC Orders ED Discharge Orders     None         Fonda Hymen, PA-C 05/15/23 1257    Sallyanne Creamer, DO 05/17/23 1453

## 2023-05-15 NOTE — Discharge Instructions (Addendum)
 It was a pleasure taking care of you today.  Today we evaluated the cause of your low back pain.  Based on your history and physical exam, the most likely diagnosis for your pain is a lumbar muscle strain.  Today you were given a anti-inflammatory injection for pain control.  You were also prescribed anti-inflammatory medicine, lidocaine patches, and a muscle relaxer for outpatient therapy.  Please take these medications as prescribed.  If pain and symptoms persist or worsen, please return to the emergency department or follow-up with your primary care provider.  Symptoms to look out for include but are not limited to unexplained weakness, worsening low back pain, urinary/stool incontinence, changes in sensation. Please do not take your first dose of Naproxen until 6pm.
